# Patient Record
Sex: Female | Born: 2005 | Race: White | Hispanic: No | Marital: Single | Smoking: Never smoker
Health system: Southern US, Community
[De-identification: ages and names within clinical notes are randomized; demographics above are authoritative.]

## PROBLEM LIST (undated history)

## (undated) DIAGNOSIS — T7840XA Allergy, unspecified, initial encounter: Secondary | ICD-10-CM

## (undated) DIAGNOSIS — L309 Dermatitis, unspecified: Secondary | ICD-10-CM

## (undated) DIAGNOSIS — F909 Attention-deficit hyperactivity disorder, unspecified type: Secondary | ICD-10-CM

## (undated) DIAGNOSIS — E669 Obesity, unspecified: Secondary | ICD-10-CM

## (undated) DIAGNOSIS — R5600 Simple febrile convulsions: Secondary | ICD-10-CM

## (undated) HISTORY — DX: Allergy, unspecified, initial encounter: T78.40XA

## (undated) HISTORY — DX: Simple febrile convulsions: R56.00

## (undated) HISTORY — DX: Obesity, unspecified: E66.9

## (undated) HISTORY — DX: Dermatitis, unspecified: L30.9

## (undated) HISTORY — PX: CYST EXCISION: SHX5701

## (undated) HISTORY — DX: Attention-deficit hyperactivity disorder, unspecified type: F90.9

---
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
## 2023-12-05 ENCOUNTER — Encounter: Payer: Self-pay | Admitting: Emergency Medicine

## 2023-12-05 ENCOUNTER — Ambulatory Visit
Admission: EM | Admit: 2023-12-05 | Discharge: 2023-12-05 | Disposition: A | Discharge status: Home / Self Care | Attending: Emergency Medicine | Admitting: Emergency Medicine

## 2023-12-05 DIAGNOSIS — B9689 Other specified bacterial agents as the cause of diseases classified elsewhere: Secondary | ICD-10-CM

## 2023-12-05 DIAGNOSIS — J019 Acute sinusitis, unspecified: Secondary | ICD-10-CM | POA: Diagnosis not present

## 2023-12-05 MED ORDER — AMOXICILLIN-POT CLAVULANATE 875-125 MG PO TABS: 1.0000 | ORAL_TABLET | Freq: Two times a day (BID) | ORAL | 0 refills | Status: AC

## 2023-12-05 NOTE — Discharge Instructions (Signed)
 Augmentin  twice daily for 7 days in a row.  Always take with food to avoid upset stomach.  Make sure you finish the entire course, there should be no pills leftover. It can take 3 or 4 days for the medicine to start working.  Continue other symptomatic care in the meantime

## 2023-12-05 NOTE — ED Triage Notes (Signed)
 Pt presents c/o ear pain, sore throat, and nasal congestion x 1 week. Pt says she took OTC meds for the sore throat and that did bring her a little relief. However, her left ear still hurts and her nose is congested.

## 2023-12-05 NOTE — ED Provider Notes (Signed)
 [facility]    CSN: [id number] Arrival date & time: 12/05/23  1811      History   Chief Complaint Chief Complaint  Patient presents with   Otalgia   Nasal Congestion    HPI Anne Reed is a 18 y.o. female.  Here with 1-2 week history of bilateral  ear pain and nasal congestion At first had sore throat but that has improved.  Denies fever or cough No abd pain, NVD, rash OTC meds helped with symptoms  No known sick contacts   Past Medical History:  Diagnosis Date   ADHD (attention deficit hyperactivity disorder)    Allergy    Eczema    Febrile seizures (HCC)    Obesity     Patient Active Problem List   Diagnosis Date Noted   BMI (body mass index), pediatric, 95-99% for age 60/18/2016   ADHD (attention deficit hyperactivity disorder) 07/03/2014   Basic learning disability 07/04/2013   Eczema 03/08/2013   Allergic rhinitis 03/08/2013    Past Surgical History:  Procedure Laterality Date   CYST EXCISION Left    top of left foot    OB History   No obstetric history on file.      Home Medications    Prior to Admission medications   Medication Sig Start Date End Date Taking? Authorizing Provider  amoxicillin -clavulanate (AUGMENTIN ) 875-125 MG tablet Take 1 tablet by mouth every 12 (twelve) hours for 7 days. 12/05/23 12/12/23 Yes Reya Aurich, [place], PA-C  acetaminophen (TYLENOL) 500 MG tablet Take 500 mg by mouth every 6 (six) hours as needed for mild pain. Reported on 12/04/2015    [provider]  guanFACINE (INTUNIV) 2 MG TB24 SR tablet Take 2 mg by mouth daily. Reported on 12/04/2015 06/26/15   [provider]    Family History Family History  Problem Relation Age of Onset   Hypertension Maternal Aunt    Hypertension Maternal Grandmother    Hypertension Maternal Grandfather    Seizures Paternal Grandmother    Asthma Paternal Grandfather     Social History Social History   Tobacco Use   Smoking status: Never     Passive exposure: Yes   Smokeless tobacco: Never  Vaping Use   Vaping status: Every Day   Substances: Nicotine  Substance Use Topics   Alcohol use: Yes    Comment: Occassionally   Drug use: Yes    Types: Marijuana     Allergies   Patient has no known allergies.   Review of Systems Review of Systems As per HPI  Physical Exam Triage Vital Signs ED Triage Vitals  Encounter Vitals Group     BP 12/05/23 1847 115/80     Girls Systolic BP Percentile --      Girls Diastolic BP Percentile --      Boys Systolic BP Percentile --      Boys Diastolic BP Percentile --      Pulse Rate 12/05/23 1847 64     Resp 12/05/23 1847 18     Temp 12/05/23 1847 97.8 F (36.6 C)     Temp Source 12/05/23 1847 Oral     SpO2 12/05/23 1847 97 %     Weight 12/05/23 1844 202 lb (91.6 kg)     Height --      Head Circumference --      Peak Flow --      Pain Score 12/05/23 1843 7     Pain Loc --  Pain Education --      Exclude from Growth Chart --    No data found.  Updated Vital Signs BP 115/80 (BP Location: Left Arm)   Pulse 64   Temp 97.8 F (36.6 C) (Oral)   Resp 18   Wt 202 lb (91.6 kg)   LMP 11/06/2023 (Approximate)   SpO2 97%    Physical Exam Vitals and nursing note reviewed.  Constitutional:      General: She is not in acute distress.    Appearance: Normal appearance.  HENT:     Right Ear: Tympanic membrane and ear canal normal.     Left Ear: Tympanic membrane and ear canal normal.     Nose: Congestion present.     Mouth/Throat:     Pharynx: Oropharynx is clear.  Cardiovascular:     Rate and Rhythm: Normal rate and regular rhythm.     Pulses: Normal pulses.     Heart sounds: Normal heart sounds.  Pulmonary:     Effort: Pulmonary effort is normal.     Breath sounds: Normal breath sounds.  Abdominal:     Palpations: Abdomen is soft.  Musculoskeletal:     Cervical back: Normal range of motion. No rigidity.  Neurological:     Mental Status: She is alert and  oriented to person, place, and time.     UC Treatments / Results  Labs (all labs ordered are listed, but only abnormal results are displayed) Labs Reviewed - No data to display  EKG  Radiology No results found.  Procedures Procedures   Medications Ordered in UC Medications - No data to display  Initial Impression / Assessment and Plan / UC Course  I have reviewed the triage vital signs and the nursing notes.  Pertinent labs & imaging results that were available during my care of the patient were reviewed by me and considered in my medical decision making (see chart for details).  1-2 weeks of nasal congestion and ear pain, gradually progressing  Acute bacterial sinusitis Augmentin  twice daily for 7 days. Return precautions No questions  Final Clinical Impressions(s) / UC Diagnoses   Final diagnoses:  Acute bacterial sinusitis     Discharge Instructions      Augmentin  twice daily for 7 days in a row.  Always take with food to avoid upset stomach.  Make sure you finish the entire course, there should be no pills leftover. It can take 3 or 4 days for the medicine to start working.  Continue other symptomatic care in the meantime     ED Prescriptions     Medication Sig Dispense Auth. Provider   amoxicillin -clavulanate (AUGMENTIN ) 875-125 MG tablet Take 1 tablet by mouth every 12 (twelve) hours for 7 days. 14 tablet Carin Shipp, Asberry, PA-C      PDMP not reviewed this encounter.   Jeryl Asberry RIGGERS 12/05/23 8063
